# Patient Record
Sex: Male | Born: 1960 | Race: White | Hispanic: No | Marital: Married | Smoking: Former smoker
Health system: Southern US, Community
[De-identification: ages and names within clinical notes are randomized; demographics above are authoritative.]

## PROBLEM LIST (undated history)

## (undated) DIAGNOSIS — K219 Gastro-esophageal reflux disease without esophagitis: Secondary | ICD-10-CM

## (undated) DIAGNOSIS — F419 Anxiety disorder, unspecified: Secondary | ICD-10-CM

## (undated) DIAGNOSIS — G5760 Lesion of plantar nerve, unspecified lower limb: Secondary | ICD-10-CM

## (undated) DIAGNOSIS — B0229 Other postherpetic nervous system involvement: Secondary | ICD-10-CM

## (undated) HISTORY — DX: Anxiety disorder, unspecified: F41.9

## (undated) HISTORY — PX: TONSILLECTOMY: SUR1361

## (undated) HISTORY — DX: Other postherpetic nervous system involvement: B02.29

## (undated) HISTORY — DX: Lesion of plantar nerve, unspecified lower limb: G57.60

## (undated) HISTORY — DX: Gastro-esophageal reflux disease without esophagitis: K21.9

---

## 2007-06-16 ENCOUNTER — Ambulatory Visit (HOSPITAL_COMMUNITY): Admission: RE | Admit: 2007-06-16 | Discharge: 2007-06-16 | Payer: Self-pay | Admitting: Family Medicine

## 2007-06-20 ENCOUNTER — Ambulatory Visit (HOSPITAL_COMMUNITY): Admission: RE | Admit: 2007-06-20 | Discharge: 2007-06-20 | Payer: Self-pay | Admitting: Family Medicine

## 2011-06-16 ENCOUNTER — Telehealth: Payer: Self-pay

## 2011-06-16 NOTE — Telephone Encounter (Signed)
LMOM at home to call. Other available numbers were not working.

## 2011-06-19 NOTE — Telephone Encounter (Signed)
LMOM to call.  Mailing letter also to call.

## 2011-07-16 ENCOUNTER — Telehealth: Payer: Self-pay

## 2011-07-16 NOTE — Telephone Encounter (Signed)
LMOM to call.

## 2011-07-21 NOTE — Telephone Encounter (Signed)
LMOM for pt to call. Mailing a letter also. Sending a letter to Dr. Phillips Odor.

## 2012-08-02 ENCOUNTER — Telehealth: Payer: Self-pay

## 2012-08-02 NOTE — Telephone Encounter (Signed)
Called pt to triage for colonoscopy. OV with Tana Coast, PA on 08/22/2012 due to meds.

## 2012-08-22 ENCOUNTER — Other Ambulatory Visit: Payer: Self-pay | Admitting: Internal Medicine

## 2012-08-22 ENCOUNTER — Ambulatory Visit (INDEPENDENT_AMBULATORY_CARE_PROVIDER_SITE_OTHER): Payer: BC Managed Care – PPO | Admitting: Gastroenterology

## 2012-08-22 ENCOUNTER — Encounter: Payer: Self-pay | Admitting: Gastroenterology

## 2012-08-22 VITALS — BP 117/75 | HR 63 | Temp 98.2°F | Ht 69.0 in | Wt 185.8 lb

## 2012-08-22 DIAGNOSIS — Z1211 Encounter for screening for malignant neoplasm of colon: Secondary | ICD-10-CM

## 2012-08-22 DIAGNOSIS — K219 Gastro-esophageal reflux disease without esophagitis: Secondary | ICD-10-CM

## 2012-08-22 MED ORDER — PEG-KCL-NACL-NASULF-NA ASC-C 100 G PO SOLR: 1.0000 | ORAL | Status: DC

## 2012-08-22 NOTE — Progress Notes (Signed)
CC PCP 

## 2012-08-22 NOTE — Assessment & Plan Note (Signed)
Chronic GERD. Symptoms well-controlled on PPI. No prior upper endoscopy. Recommend EGD at this time to screen for Barrett's esophagus.  I have discussed the risks, alternatives, benefits with regards to but not limited to the risk of reaction to medication, bleeding, infection, perforation and the patient is agreeable to proceed. Written consent to be obtained.

## 2012-08-22 NOTE — Progress Notes (Signed)
Primary Care Physician:  MCGOUGH,WILLIAM M, MD  Primary Gastroenterologist:  Michael Rourk, MD   Chief Complaint  Patient presents with  . Colonoscopy    HPI:  Aaron Sheppard is a 51 y.o. male here to schedule his first ever screening colonoscopy. He was asked to be seen today in order to determine what type of sedation he would require given his chronic Xanax and Vicodin use. He denies any constipation, diarrhea, abdominal pain, melena, rectal bleeding, weight loss, dysphagia, odynophagia. He does have a history of chronic GERD. He has been on protonix for greater than 5 years. He states that he makes "too much acid" according to a test he had done years ago. He denies any prior upper endoscopy.  Current Outpatient Prescriptions  Medication Sig Dispense Refill  . ALPRAZolam (XANAX) 1 MG tablet 1 mg 4 (four) times daily as needed.       . HYDROcodone-acetaminophen (NORCO/VICODIN) 5-325 MG per tablet Take 1 tablet by mouth every 6 (six) hours as needed.       . pantoprazole (PROTONIX) 40 MG tablet Take 40 mg by mouth daily.        No current facility-administered medications for this visit.    Allergies as of 08/22/2012  . (No Known Allergies)    Past Medical History  Diagnosis Date  . Morton neuroma     chronic foot pain  . GERD (gastroesophageal reflux disease)     Past Surgical History  Procedure Laterality Date  . Tonsillectomy      Family History  Problem Relation Age of Onset  . Colon cancer Neg Hx   . Brain cancer Father   . Cancer Other     2 aunts, 3 uncles not sure what type  . Liver disease Neg Hx     History   Social History  . Marital Status: Married    Spouse Name: N/A    Number of Children: 0  . Years of Education: N/A   Occupational History  . [employer]    Social History Main Topics  . Smoking status: Current Every Day Smoker -- 0.50 packs/day    Types: Cigarettes  . Smokeless tobacco: Not on file  . Alcohol Use: Yes     Comment: a  beer twice a week  . Drug Use: No  . Sexually Active: Not on file   Other Topics Concern  . Not on file   Social History Narrative  . No narrative on file      ROS:  General: Negative for anorexia, weight loss, fever, chills, fatigue, weakness. Eyes: Negative for vision changes.  ENT: Negative for hoarseness, difficulty swallowing , nasal congestion. CV: Negative for chest pain, angina, palpitations, dyspnea on exertion, peripheral edema.  Respiratory: Negative for dyspnea at rest, dyspnea on exertion, cough, sputum, wheezing.  GI: See history of present illness. GU:  Negative for dysuria, hematuria, urinary incontinence, urinary frequency, nocturnal urination.  MS: chronic foot pain, negative for low back pain.  Derm: Negative for rash or itching.  Neuro: Negative for weakness, abnormal sensation, seizure, frequent headaches, memory loss, confusion.  Psych: Negative for anxiety, depression, suicidal ideation, hallucinations.  Endo: Negative for unusual weight change.  Heme: Negative for bruising or bleeding. Allergy: Negative for rash or hives.    Physical Examination:  BP 117/75  Pulse 63  Temp(Src) 98.2 F (36.8 C) (Oral)  Ht 5' 9" (1.753 m)  Wt 185 lb 12.8 oz (84.278 kg)  BMI 27.43 kg/m2   General:   Well-nourished, well-developed in no acute distress.  Head: Normocephalic, atraumatic.   Eyes: Conjunctiva pink, no icterus. Mouth: Oropharyngeal mucosa moist and pink , no lesions erythema or exudate. Neck: Supple without thyromegaly, masses, or lymphadenopathy.  Lungs: Clear to auscultation bilaterally.  Heart: Regular rate and rhythm, no murmurs rubs or gallops.  Abdomen: Bowel sounds are normal, nontender, nondistended, no hepatosplenomegaly or masses, no abdominal bruits or    hernia , no rebound or guarding.   Rectal: defer Extremities: No lower extremity edema. No clubbing or deformities.  Neuro: Alert and oriented x 4 , grossly normal neurologically.  Skin:  Warm and dry, no rash or jaundice.   Psych: Alert and cooperative, normal mood and affect.   

## 2012-08-22 NOTE — Patient Instructions (Addendum)
We have scheduled you for a colonoscopy and upper endoscopy with Dr. Rourk. Please see separate instructions. 

## 2012-08-22 NOTE — Assessment & Plan Note (Signed)
No prior colonoscopy. Average risk screening colonoscopy in the near future. Patient takes daily narcotics and Xanax therefore we'll augment conscious sedation with Phenergan 25 mg IV 30 minutes before procedure. Discussed with patient.  I have discussed the risks, alternatives, benefits with regards to but not limited to the risk of reaction to medication, bleeding, infection, perforation and the patient is agreeable to proceed. Written consent to be obtained.

## 2012-09-01 ENCOUNTER — Encounter (HOSPITAL_COMMUNITY): Payer: Self-pay | Admitting: Pharmacy Technician

## 2012-09-08 ENCOUNTER — Encounter (HOSPITAL_COMMUNITY): Payer: Self-pay | Admitting: *Deleted

## 2012-09-08 ENCOUNTER — Ambulatory Visit (HOSPITAL_COMMUNITY)
Admission: RE | Admit: 2012-09-08 | Discharge: 2012-09-08 | Disposition: A | Discharge status: Home Health | Payer: BC Managed Care – PPO | Source: Ambulatory Visit | Attending: Internal Medicine | Admitting: Internal Medicine

## 2012-09-08 ENCOUNTER — Encounter (HOSPITAL_COMMUNITY)
Admission: RE | Disposition: A | Discharge status: Home Health | Payer: Self-pay | Source: Ambulatory Visit | Attending: Internal Medicine

## 2012-09-08 DIAGNOSIS — Z1211 Encounter for screening for malignant neoplasm of colon: Secondary | ICD-10-CM

## 2012-09-08 DIAGNOSIS — K5730 Diverticulosis of large intestine without perforation or abscess without bleeding: Secondary | ICD-10-CM | POA: Insufficient documentation

## 2012-09-08 DIAGNOSIS — K219 Gastro-esophageal reflux disease without esophagitis: Secondary | ICD-10-CM

## 2012-09-08 HISTORY — PX: COLONOSCOPY: SHX5424

## 2012-09-08 SURGERY — COLONOSCOPY
Anesthesia: Moderate Sedation | Wound class: Clean Contaminated

## 2012-09-08 MED ORDER — SODIUM CHLORIDE 0.9 % IJ SOLN
INTRAMUSCULAR | Status: AC
  Filled 2012-09-08: qty 10

## 2012-09-08 MED ORDER — MIDAZOLAM HCL 5 MG/5ML IJ SOLN
INTRAMUSCULAR | Status: DC | PRN
  Administered 2012-09-08: 1 mg via INTRAVENOUS
  Administered 2012-09-08 (×2): 2 mg via INTRAVENOUS

## 2012-09-08 MED ORDER — ONDANSETRON HCL 4 MG/2ML IJ SOLN
INTRAMUSCULAR | Status: AC
  Filled 2012-09-08: qty 2

## 2012-09-08 MED ORDER — PROMETHAZINE HCL 25 MG/ML IJ SOLN
25.0000 mg | Freq: Once | INTRAMUSCULAR | Status: AC
  Administered 2012-09-08: 25 mg via INTRAVENOUS

## 2012-09-08 MED ORDER — MEPERIDINE HCL 100 MG/ML IJ SOLN
INTRAMUSCULAR | Status: DC | PRN
  Administered 2012-09-08: 50 mg via INTRAVENOUS
  Administered 2012-09-08: 25 mg via INTRAVENOUS

## 2012-09-08 MED ORDER — SODIUM CHLORIDE 0.9 % IV SOLN
INTRAVENOUS | Status: DC
  Administered 2012-09-08: 12:00:00 via INTRAVENOUS

## 2012-09-08 MED ORDER — ONDANSETRON HCL 4 MG/2ML IJ SOLN
INTRAMUSCULAR | Status: DC | PRN
  Administered 2012-09-08: 4 mg via INTRAVENOUS

## 2012-09-08 MED ORDER — STERILE WATER FOR IRRIGATION IR SOLN
Status: DC | PRN
  Administered 2012-09-08: 13:00:00

## 2012-09-08 MED ORDER — MIDAZOLAM HCL 5 MG/5ML IJ SOLN
INTRAMUSCULAR | Status: AC
  Filled 2012-09-08: qty 10

## 2012-09-08 MED ORDER — PROMETHAZINE HCL 25 MG/ML IJ SOLN
INTRAMUSCULAR | Status: AC
  Filled 2012-09-08: qty 1

## 2012-09-08 MED ORDER — MEPERIDINE HCL 100 MG/ML IJ SOLN
INTRAMUSCULAR | Status: AC
  Filled 2012-09-08: qty 1

## 2012-09-08 NOTE — OR Nursing (Signed)
Patient scheduled for a EGD and TCS. On arrival patient states that he called the office and told them that he wanted to cancel the EGD because his insurance would not pay for it. Dr. Jena Gauss notified. Will proceed with TCS only today.

## 2012-09-08 NOTE — Op Note (Signed)
[facility] [address] [place], [address]   COLONOSCOPY PROCEDURE REPORT  PATIENT: Aaron Sheppard, Aaron Sheppard  MR#:         [id number] BIRTHDATE: 05-07-1961 , 51  yrs. old GENDER: Male ENDOSCOPIST: R.  Roetta Sessions, MD FACP FACG REFERRED BY:  Karleen Hampshire, M.D. PROCEDURE DATE:  09/08/2012 PROCEDURE:     Screening colonoscopy  INDICATIONS: First ever average risk colorectal cancer screening examination; patient declines EGD for long-standing GERD  INFORMED CONSENT:  The risks, benefits, alternatives and imponderables including but not limited to bleeding, perforation as well as the possibility of a missed lesion have been reviewed.  The potential for biopsy, lesion removal, etc. have also been discussed.  Questions have been answered.  All parties agreeable. Please see the history and physical in the medical record for more information.  MEDICATIONS: Versed 5 mg IV Demerol 75 mg IV in divided doses. Zofran 4 mg Phenergan 25 mgI  DESCRIPTION OF PROCEDURE:  After a digital rectal exam was performed, the EC-3890Li ([id number])  colonoscope was advanced from the anus through the rectum and colon to the area of the cecum, ileocecal valve and appendiceal orifice.  The cecum was deeply intubated.  These structures were well-seen and photographed for the record.  From the level of the cecum and ileocecal valve, the scope was slowly and cautiously withdrawn.  The mucosal surfaces were carefully surveyed utilizing scope tip deflection to facilitate fold flattening as needed.  The scope was pulled down into the rectum where a thorough examination including retroflexion was performed.    FINDINGS:  Adequate preparation. Normal rectum. Scattered left-sided diverticula; the remainder of the colonic mucosa appeared normal.  THERAPEUTIC / DIAGNOSTIC MANEUVERS PERFORMED:  none  COMPLICATIONS: None  CECAL WITHDRAWAL TIME:  11 minutes  IMPRESSION:  Colonic  diverticulosis  RECOMMENDATIONS: Repeat screening colonoscopy in 10 years   _______________________________ eSigned:  R. Roetta Sessions, MD FACP [facility] 09/08/2012 1:09 PM   CC:

## 2012-09-08 NOTE — H&P (View-Only) (Signed)
Primary Care Physician:  Kirk Ruths, MD  Primary Gastroenterologist:  Roetta Sessions, MD   Chief Complaint  Patient presents with  . Colonoscopy    HPI:  Aaron Sheppard is a 52 y.o. male here to schedule his first ever screening colonoscopy. He was asked to be seen today in order to determine what type of sedation he would require given his chronic Xanax and Vicodin use. He denies any constipation, diarrhea, abdominal pain, melena, rectal bleeding, weight loss, dysphagia, odynophagia. He does have a history of chronic GERD. He has been on protonix for greater than 5 years. He states that he makes "too much acid" according to a test he had done years ago. He denies any prior upper endoscopy.  Current Outpatient Prescriptions  Medication Sig Dispense Refill  . ALPRAZolam (XANAX) 1 MG tablet 1 mg 4 (four) times daily as needed.       Marland Kitchen HYDROcodone-acetaminophen (NORCO/VICODIN) 5-325 MG per tablet Take 1 tablet by mouth every 6 (six) hours as needed.       . pantoprazole (PROTONIX) 40 MG tablet Take 40 mg by mouth daily.        No current facility-administered medications for this visit.    Allergies as of 08/22/2012  . (No Known Allergies)    Past Medical History  Diagnosis Date  . Morton neuroma     chronic foot pain  . GERD (gastroesophageal reflux disease)     Past Surgical History  Procedure Laterality Date  . Tonsillectomy      Family History  Problem Relation Age of Onset  . Colon cancer Neg Hx   . Brain cancer Father   . Cancer Other     2 aunts, 3 uncles not sure what type  . Liver disease Neg Hx     History   Social History  . Marital Status: Married    Spouse Name: N/A    Number of Children: 0  . Years of Education: N/A   Occupational History  . [employer]    Social History Main Topics  . Smoking status: Current Every Day Smoker -- 0.50 packs/day    Types: Cigarettes  . Smokeless tobacco: Not on file  . Alcohol Use: Yes     Comment: a  beer twice a week  . Drug Use: No  . Sexually Active: Not on file   Other Topics Concern  . Not on file   Social History Narrative  . No narrative on file      ROS:  General: Negative for anorexia, weight loss, fever, chills, fatigue, weakness. Eyes: Negative for vision changes.  ENT: Negative for hoarseness, difficulty swallowing , nasal congestion. CV: Negative for chest pain, angina, palpitations, dyspnea on exertion, peripheral edema.  Respiratory: Negative for dyspnea at rest, dyspnea on exertion, cough, sputum, wheezing.  GI: See history of present illness. GU:  Negative for dysuria, hematuria, urinary incontinence, urinary frequency, nocturnal urination.  MS: chronic foot pain, negative for low back pain.  Derm: Negative for rash or itching.  Neuro: Negative for weakness, abnormal sensation, seizure, frequent headaches, memory loss, confusion.  Psych: Negative for anxiety, depression, suicidal ideation, hallucinations.  Endo: Negative for unusual weight change.  Heme: Negative for bruising or bleeding. Allergy: Negative for rash or hives.    Physical Examination:  BP 117/75  Pulse 63  Temp(Src) 98.2 F (36.8 C) (Oral)  Ht 5\' 9"  (1.753 m)  Wt 185 lb 12.8 oz (84.278 kg)  BMI 27.43 kg/m2   General:  Well-nourished, well-developed in no acute distress.  Head: Normocephalic, atraumatic.   Eyes: Conjunctiva pink, no icterus. Mouth: Oropharyngeal mucosa moist and pink , no lesions erythema or exudate. Neck: Supple without thyromegaly, masses, or lymphadenopathy.  Lungs: Clear to auscultation bilaterally.  Heart: Regular rate and rhythm, no murmurs rubs or gallops.  Abdomen: Bowel sounds are normal, nontender, nondistended, no hepatosplenomegaly or masses, no abdominal bruits or    hernia , no rebound or guarding.   Rectal: defer Extremities: No lower extremity edema. No clubbing or deformities.  Neuro: Alert and oriented x 4 , grossly normal neurologically.  Skin:  Warm and dry, no rash or jaundice.   Psych: Alert and cooperative, normal mood and affect.

## 2012-09-08 NOTE — Interval H&P Note (Signed)
History and Physical Interval Note:  09/08/2012 12:33 PM  Aaron Sheppard  has presented today for surgery, with the diagnosis of Chronic GERD and Screening Colonoscopy  The various methods of treatment have been discussed with the patient and family. After consideration of risks, benefits and other options for treatment, the patient has consented to  Procedure(s) with comments: COLONOSCOPY WITH ESOPHAGOGASTRODUODENOSCOPY (EGD) (N/A) - 1:00 as a surgical intervention .  The patient's history has been reviewed, patient examined, no change in status, stable for surgery.  I have reviewed the patient's chart and labs.  Questions were answered to the patient's satisfaction.     Eula Listen Colonoscopy today for screening purposes per plan. Patient declines to have an EGD sliding financial responsibility. I explained to him the risks of not diagnosing of Barrett's, etc. He seems to understand.The risks, benefits, limitations, alternatives and imponderables have been reviewed with the patient. Questions have been answered. All parties are agreeable.

## 2012-09-12 ENCOUNTER — Encounter (HOSPITAL_COMMUNITY): Payer: Self-pay | Admitting: Internal Medicine

## 2012-10-19 ENCOUNTER — Telehealth: Payer: Self-pay | Admitting: General Practice

## 2012-10-19 NOTE — Telephone Encounter (Signed)
Patient called an wondered why his tcs wasn't covered at 100%.  I spoke with Neill Loft. At [phone] and she stated the [facility] plan states screenings covered at 80% and Medical at 90%.   I called and explained that to the patient.  He voiced understanding.

## 2012-11-23 NOTE — Progress Notes (Signed)
REVIEWED.  

## 2013-03-16 ENCOUNTER — Other Ambulatory Visit: Payer: Self-pay

## 2014-07-03 ENCOUNTER — Emergency Department (HOSPITAL_COMMUNITY): Payer: BLUE CROSS/BLUE SHIELD

## 2014-07-03 ENCOUNTER — Encounter (HOSPITAL_COMMUNITY): Payer: Self-pay | Admitting: *Deleted

## 2014-07-03 ENCOUNTER — Emergency Department (HOSPITAL_COMMUNITY)
Admission: EM | Admit: 2014-07-03 | Discharge: 2014-07-03 | Disposition: A | Discharge status: Home / Self Care | Payer: BLUE CROSS/BLUE SHIELD | Attending: Emergency Medicine | Admitting: Emergency Medicine

## 2014-07-03 DIAGNOSIS — S199XXA Unspecified injury of neck, initial encounter: Secondary | ICD-10-CM | POA: Insufficient documentation

## 2014-07-03 DIAGNOSIS — G8929 Other chronic pain: Secondary | ICD-10-CM | POA: Insufficient documentation

## 2014-07-03 DIAGNOSIS — M545 Low back pain: Secondary | ICD-10-CM

## 2014-07-03 DIAGNOSIS — S3992XA Unspecified injury of lower back, initial encounter: Secondary | ICD-10-CM | POA: Insufficient documentation

## 2014-07-03 DIAGNOSIS — Z720 Tobacco use: Secondary | ICD-10-CM | POA: Diagnosis not present

## 2014-07-03 DIAGNOSIS — K219 Gastro-esophageal reflux disease without esophagitis: Secondary | ICD-10-CM | POA: Diagnosis not present

## 2014-07-03 DIAGNOSIS — M542 Cervicalgia: Secondary | ICD-10-CM

## 2014-07-03 DIAGNOSIS — Y998 Other external cause status: Secondary | ICD-10-CM | POA: Diagnosis not present

## 2014-07-03 DIAGNOSIS — Y92410 Unspecified street and highway as the place of occurrence of the external cause: Secondary | ICD-10-CM | POA: Diagnosis not present

## 2014-07-03 DIAGNOSIS — Z79899 Other long term (current) drug therapy: Secondary | ICD-10-CM | POA: Insufficient documentation

## 2014-07-03 DIAGNOSIS — Y9389 Activity, other specified: Secondary | ICD-10-CM | POA: Diagnosis not present

## 2014-07-03 LAB — URINALYSIS, ROUTINE W REFLEX MICROSCOPIC
Bilirubin Urine: NEGATIVE
Glucose, UA: NEGATIVE mg/dL
Ketones, ur: NEGATIVE mg/dL
Leukocytes, UA: NEGATIVE
Nitrite: NEGATIVE
Protein, ur: NEGATIVE mg/dL
Specific Gravity, Urine: 1.01 (ref 1.005–1.030)
Urobilinogen, UA: 0.2 mg/dL (ref 0.0–1.0)
pH: 5.5 (ref 5.0–8.0)

## 2014-07-03 LAB — URINE MICROSCOPIC-ADD ON

## 2014-07-03 MED ORDER — NAPROXEN 500 MG PO TABS: ORAL_TABLET | ORAL | Status: DC

## 2014-07-03 MED ORDER — CYCLOBENZAPRINE HCL 5 MG PO TABS: 5.0000 mg | ORAL_TABLET | Freq: Three times a day (TID) | ORAL | Status: DC | PRN

## 2014-07-03 MED ORDER — CYCLOBENZAPRINE HCL 10 MG PO TABS
5.0000 mg | ORAL_TABLET | Freq: Once | ORAL | Status: AC
  Administered 2014-07-03: 5 mg via ORAL
  Filled 2014-07-03: qty 1

## 2014-07-03 MED ORDER — IBUPROFEN 800 MG PO TABS
800.0000 mg | ORAL_TABLET | Freq: Once | ORAL | Status: AC
  Administered 2014-07-03: 800 mg via ORAL
  Filled 2014-07-03: qty 1

## 2014-07-03 NOTE — Discharge Instructions (Signed)
Ice packs and heat  to the injured or sore muscles. Take the medications for pain and muscle spasms. Return to the ED for any problems listed on the head injury sheet. Recheck if you aren't improving in the next week.
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
## 2014-08-01 ENCOUNTER — Other Ambulatory Visit (HOSPITAL_COMMUNITY): Payer: Self-pay | Admitting: Internal Medicine

## 2014-08-01 DIAGNOSIS — M5127 Other intervertebral disc displacement, lumbosacral region: Secondary | ICD-10-CM

## 2014-08-01 DIAGNOSIS — M5020 Other cervical disc displacement, unspecified cervical region: Secondary | ICD-10-CM

## 2014-08-01 DIAGNOSIS — M5410 Radiculopathy, site unspecified: Secondary | ICD-10-CM

## 2014-08-09 ENCOUNTER — Ambulatory Visit (HOSPITAL_COMMUNITY)
Admission: RE | Admit: 2014-08-09 | Discharge: 2014-08-09 | Disposition: A | Discharge status: Home / Self Care | Payer: BLUE CROSS/BLUE SHIELD | Source: Ambulatory Visit | Attending: Internal Medicine | Admitting: Internal Medicine

## 2014-08-09 DIAGNOSIS — M4802 Spinal stenosis, cervical region: Secondary | ICD-10-CM | POA: Insufficient documentation

## 2014-08-09 DIAGNOSIS — M5127 Other intervertebral disc displacement, lumbosacral region: Secondary | ICD-10-CM

## 2014-08-09 DIAGNOSIS — M5020 Other cervical disc displacement, unspecified cervical region: Secondary | ICD-10-CM

## 2014-08-09 DIAGNOSIS — M5022 Other cervical disc displacement, mid-cervical region: Secondary | ICD-10-CM | POA: Insufficient documentation

## 2014-08-09 DIAGNOSIS — M5410 Radiculopathy, site unspecified: Secondary | ICD-10-CM

## 2014-08-13 ENCOUNTER — Other Ambulatory Visit (HOSPITAL_COMMUNITY): Payer: BLUE CROSS/BLUE SHIELD

## 2016-05-28 ENCOUNTER — Other Ambulatory Visit (HOSPITAL_COMMUNITY): Payer: Self-pay | Admitting: Family Medicine

## 2016-05-28 ENCOUNTER — Ambulatory Visit (HOSPITAL_COMMUNITY)
Admission: RE | Admit: 2016-05-28 | Discharge: 2016-05-28 | Disposition: A | Discharge status: Home / Self Care | Payer: BLUE CROSS/BLUE SHIELD | Source: Ambulatory Visit | Attending: Family Medicine | Admitting: Family Medicine

## 2016-05-28 DIAGNOSIS — J111 Influenza due to unidentified influenza virus with other respiratory manifestations: Secondary | ICD-10-CM | POA: Diagnosis present

## 2016-05-28 DIAGNOSIS — J069 Acute upper respiratory infection, unspecified: Secondary | ICD-10-CM | POA: Diagnosis present

## 2016-08-04 ENCOUNTER — Telehealth: Payer: Self-pay | Admitting: Internal Medicine

## 2016-08-04 NOTE — Telephone Encounter (Signed)
Pt is having some rectal bleeding, bright red blood. He is aware he has some hemorrhoids. He is scheduled an OV with Wynne DustEric Gill, NP on 08/26/2016 at 8:30 AM.  Routing FYI to Tana CoastLeslie Lewis, PA who last saw pt in the office several years ago.

## 2016-08-04 NOTE — Telephone Encounter (Signed)
Pt called wanting to set up colonoscopy. His last one was in May 2014 and is one to recall to repeat again in 10 yrs (2024).  Pt is wanting to have it done before then, but has a lot of questions about his insurance and how it'll be coded. He didn't want to make an OV prior to scheduling. I told him that I would let the triage nurse know that he had called. [phone]

## 2016-08-05 NOTE — Telephone Encounter (Signed)
Pt called and said he had a BM last night and he did not have bleeding. He is aware if he has the symptoms to call or go to ED. He will keep the OV as scheduled.

## 2016-08-05 NOTE — Telephone Encounter (Signed)
If modest rectal bleeding, ok for OV as scheduled in couple of weeks. If having significant rectal bleeding, lightheadedness, abdominal pain, melena, then go to ER.

## 2016-08-05 NOTE — Telephone Encounter (Signed)
LMOM to call.

## 2017-02-10 IMAGING — DX DG CHEST 2V
2 series · 2 of 2 positions shown · non-contrast
Comparison: 06/16/2007

CLINICAL DATA: Cough, congestion, and nasal drainage for 2 weeks

EXAM:
CHEST  2 VIEW

[chest pa]
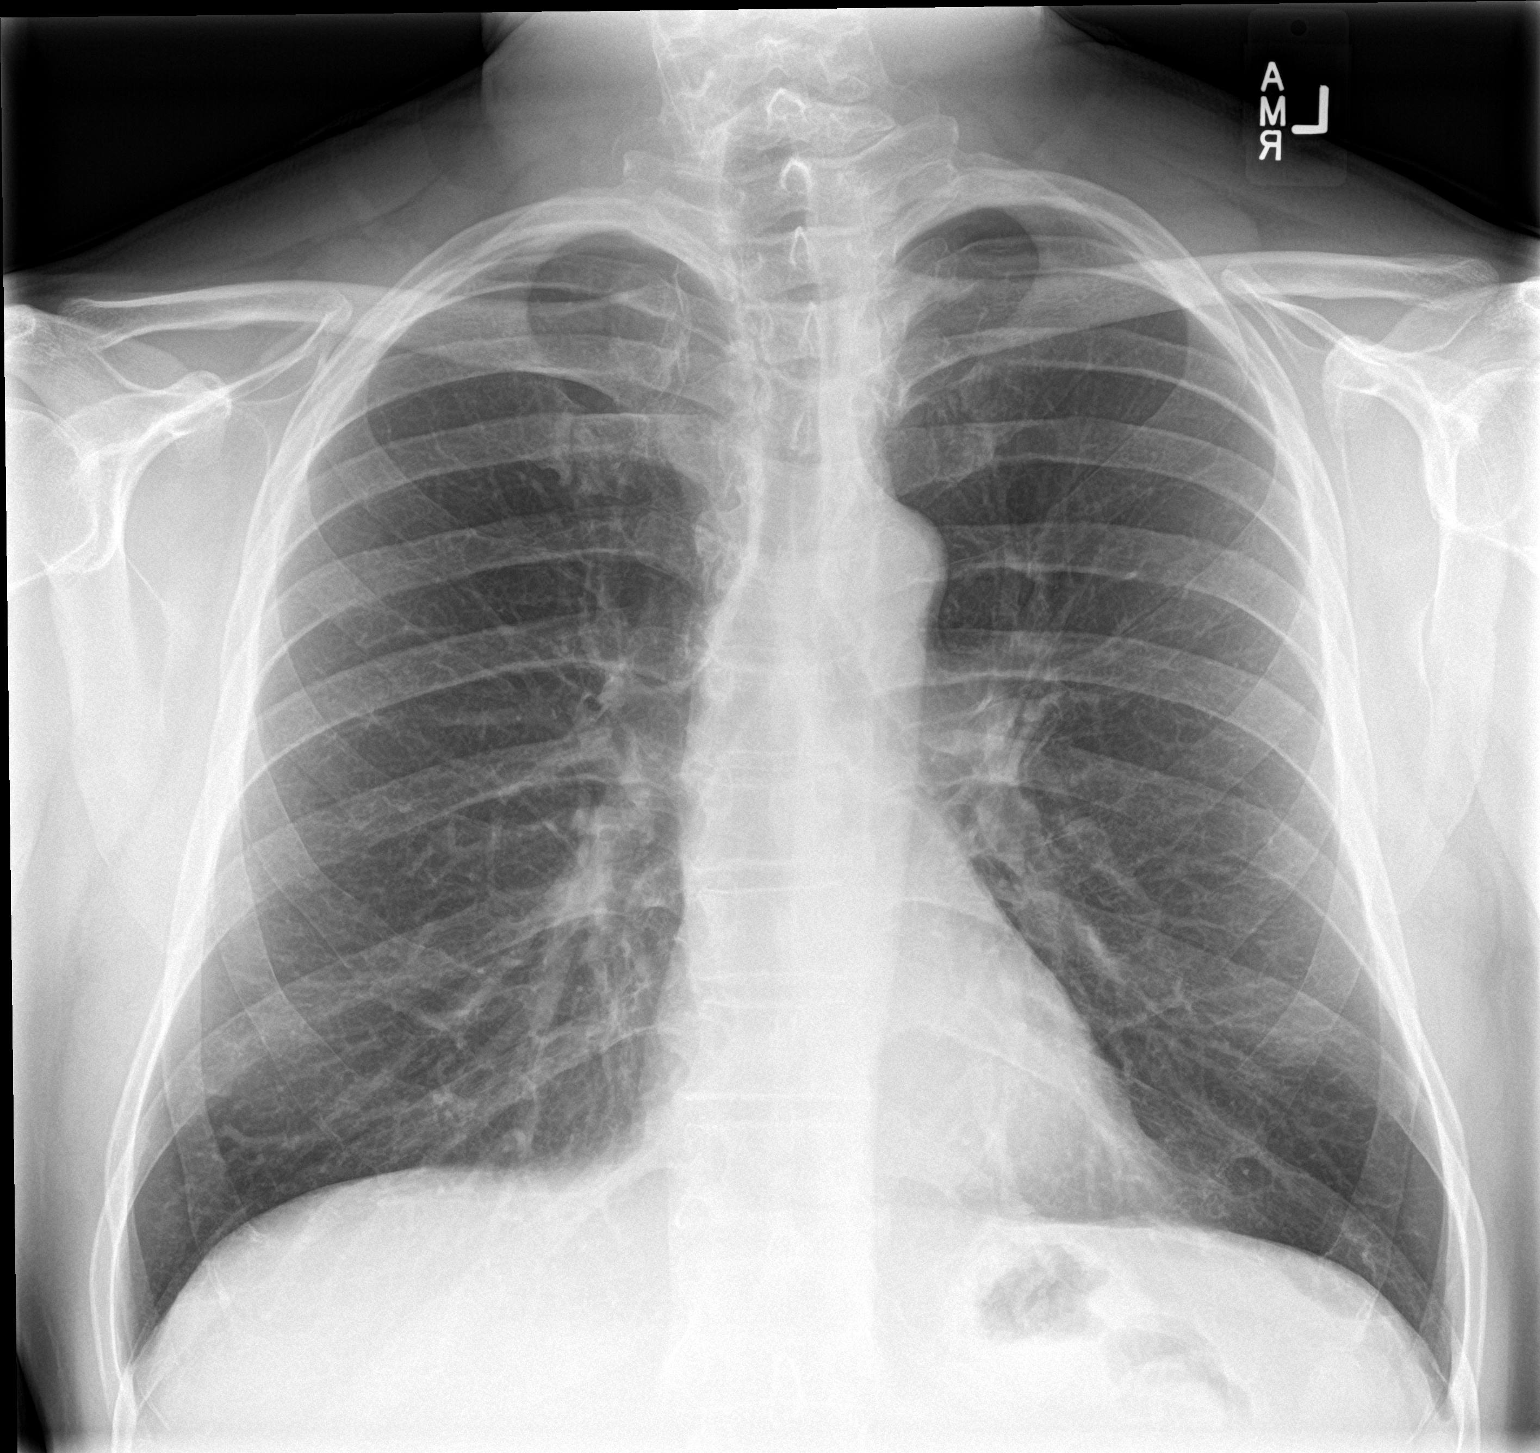

[chest lat]
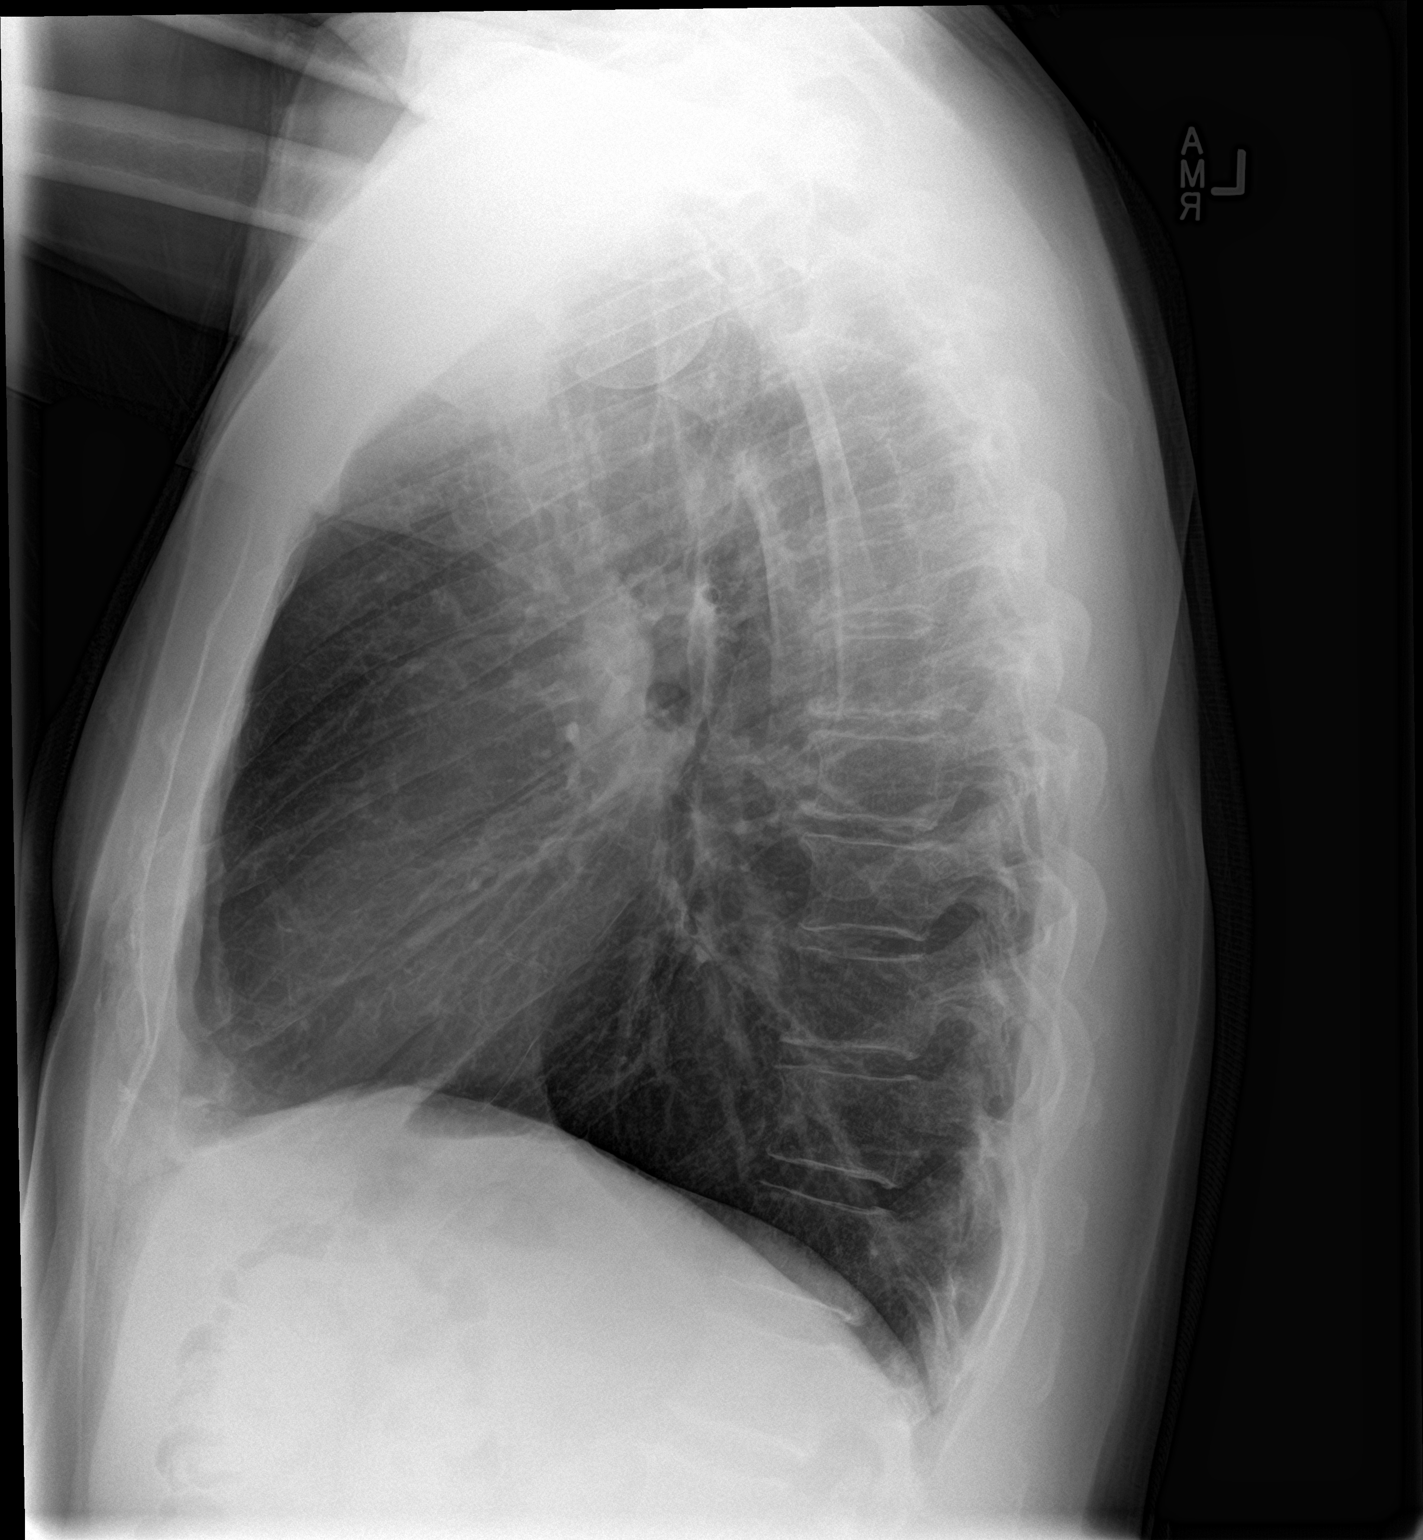

[2 of 2 positions shown; findings below may reference images not displayed]

FINDINGS: Normal heart size. Lungs clear. No pneumothorax. No pleural
effusion.
IMPRESSION: No active cardiopulmonary disease.

## 2018-12-06 ENCOUNTER — Ambulatory Visit (INDEPENDENT_AMBULATORY_CARE_PROVIDER_SITE_OTHER): Payer: Managed Care, Other (non HMO) | Admitting: Urology

## 2018-12-06 ENCOUNTER — Other Ambulatory Visit (HOSPITAL_COMMUNITY)
Admission: AD | Admit: 2018-12-06 | Discharge: 2018-12-06 | Disposition: A | Discharge status: Home / Self Care | Payer: BLUE CROSS/BLUE SHIELD | Source: Other Acute Inpatient Hospital | Attending: Urology | Admitting: Urology

## 2018-12-06 DIAGNOSIS — K4090 Unilateral inguinal hernia, without obstruction or gangrene, not specified as recurrent: Secondary | ICD-10-CM

## 2018-12-06 DIAGNOSIS — R310 Gross hematuria: Secondary | ICD-10-CM | POA: Diagnosis not present

## 2018-12-06 LAB — URINALYSIS, COMPLETE (UACMP) WITH MICROSCOPIC
Bacteria, UA: NONE SEEN
Bilirubin Urine: NEGATIVE
Glucose, UA: NEGATIVE mg/dL
Ketones, ur: NEGATIVE mg/dL
Leukocytes,Ua: NEGATIVE
Nitrite: NEGATIVE
Protein, ur: NEGATIVE mg/dL
Specific Gravity, Urine: 1.006 (ref 1.005–1.030)
pH: 5 (ref 5.0–8.0)

## 2018-12-13 ENCOUNTER — Other Ambulatory Visit: Payer: Self-pay | Admitting: Urology

## 2018-12-13 DIAGNOSIS — R310 Gross hematuria: Secondary | ICD-10-CM

## 2020-01-30 ENCOUNTER — Other Ambulatory Visit: Payer: Self-pay

## 2020-01-30 DIAGNOSIS — Z20822 Contact with and (suspected) exposure to covid-19: Secondary | ICD-10-CM

## 2020-02-01 LAB — NOVEL CORONAVIRUS, NAA: SARS-CoV-2, NAA: NOT DETECTED

## 2020-02-01 LAB — SPECIMEN STATUS REPORT

## 2020-02-01 LAB — SARS-COV-2, NAA 2 DAY TAT

## 2020-03-07 ENCOUNTER — Other Ambulatory Visit: Payer: Self-pay

## 2020-03-07 ENCOUNTER — Other Ambulatory Visit: Payer: BLUE CROSS/BLUE SHIELD

## 2020-03-07 DIAGNOSIS — Z20822 Contact with and (suspected) exposure to covid-19: Secondary | ICD-10-CM

## 2020-03-08 LAB — NOVEL CORONAVIRUS, NAA: SARS-CoV-2, NAA: NOT DETECTED

## 2020-03-08 LAB — SARS-COV-2, NAA 2 DAY TAT

## 2020-03-08 LAB — SPECIMEN STATUS REPORT

## 2020-03-11 ENCOUNTER — Other Ambulatory Visit: Payer: Self-pay | Admitting: Urology

## 2020-03-11 DIAGNOSIS — R310 Gross hematuria: Secondary | ICD-10-CM

## 2020-03-14 ENCOUNTER — Other Ambulatory Visit: Payer: Self-pay

## 2020-03-14 ENCOUNTER — Other Ambulatory Visit: Payer: BLUE CROSS/BLUE SHIELD

## 2020-03-14 DIAGNOSIS — Z20822 Contact with and (suspected) exposure to covid-19: Secondary | ICD-10-CM

## 2020-03-15 LAB — SARS-COV-2, NAA 2 DAY TAT

## 2020-03-15 LAB — NOVEL CORONAVIRUS, NAA: SARS-CoV-2, NAA: NOT DETECTED

## 2020-03-15 LAB — SPECIMEN STATUS REPORT

## 2020-03-20 ENCOUNTER — Telehealth: Payer: Self-pay | Admitting: Urology

## 2020-03-20 NOTE — Telephone Encounter (Signed)
-----   Message from Marcine Matar, MD sent at 03/19/2020  7:26 PM EST ----- Pt needs f/u OV following CT--overdue. Please schedule ----- Message ----- From: SYSTEM Sent: 03/16/2020  12:12 AM EST To: Marcine Matar, MD

## 2020-03-20 NOTE — Telephone Encounter (Signed)
I called patient to check with him on scheduling the CT that Dr Retta Diones sent [place] a note on. Patient stated he wasn't sure why the CT is needed. He asked for more information regarding this.

## 2020-03-20 NOTE — Telephone Encounter (Signed)
Pt notified of f/u OV w/ ct. Pt states that he will call office back after talking with his wife to schedule f/u and have the ct done.

## 2020-03-21 ENCOUNTER — Other Ambulatory Visit: Payer: Self-pay | Admitting: *Deleted

## 2020-03-21 ENCOUNTER — Other Ambulatory Visit: Payer: BLUE CROSS/BLUE SHIELD

## 2020-03-21 DIAGNOSIS — Z20822 Contact with and (suspected) exposure to covid-19: Secondary | ICD-10-CM

## 2020-03-23 LAB — NOVEL CORONAVIRUS, NAA: SARS-CoV-2, NAA: NOT DETECTED

## 2020-03-23 LAB — SPECIMEN STATUS REPORT

## 2020-03-23 LAB — SARS-COV-2, NAA 2 DAY TAT

## 2020-03-27 ENCOUNTER — Telehealth: Payer: Self-pay

## 2020-03-27 NOTE — Telephone Encounter (Signed)
Called pt to notify him of new appt. Lft msg at both numbers.

## 2020-03-28 ENCOUNTER — Other Ambulatory Visit: Payer: Self-pay

## 2020-03-28 ENCOUNTER — Other Ambulatory Visit: Payer: BLUE CROSS/BLUE SHIELD

## 2020-03-28 DIAGNOSIS — Z20822 Contact with and (suspected) exposure to covid-19: Secondary | ICD-10-CM

## 2020-03-29 LAB — SPECIMEN STATUS REPORT

## 2020-03-29 LAB — SARS-COV-2, NAA 2 DAY TAT

## 2020-03-29 LAB — NOVEL CORONAVIRUS, NAA: SARS-CoV-2, NAA: NOT DETECTED

## 2020-03-29 NOTE — Progress Notes (Signed)
Pt scheduled  

## 2020-04-02 ENCOUNTER — Other Ambulatory Visit: Payer: BLUE CROSS/BLUE SHIELD

## 2020-04-09 ENCOUNTER — Other Ambulatory Visit: Payer: Self-pay

## 2020-04-09 ENCOUNTER — Other Ambulatory Visit: Payer: BLUE CROSS/BLUE SHIELD

## 2020-04-09 ENCOUNTER — Ambulatory Visit: Payer: BLUE CROSS/BLUE SHIELD | Admitting: Urology

## 2020-04-09 DIAGNOSIS — Z20822 Contact with and (suspected) exposure to covid-19: Secondary | ICD-10-CM

## 2020-04-09 NOTE — Progress Notes (Incomplete)
H&P  Chief Complaint: ***  History of Present Illness:  11.30.2021:  (below copied from [facility] records):  Aaron Sheppard is a 59 year-old male patient who was referred by Dr. Redmond School, MD who is here for blood in the urine.  He did see the blood in his urine. He first noticed the symptoms 07/10/2018. He has not seen blood clots.   He does have a burning sensation when he urinates. He is not currently having trouble urinating.   He is not having pain. He has not recently had unwanted weight loss.   7.28.2020: Starting in March, he woke up with blood on the front of his underwear at least two mornings -- he reports that there were substantial blood spots. Since then he has had several instances of gross hematuria with blood throughout the stream. He has not seen any blood since. He now is experiencing a slower stream with dysuria. He reports a family history of bladder cancer and the patient has been smoking consistently for the last five years but has smoked previously.    Past Medical History:  Diagnosis Date  . GERD (gastroesophageal reflux disease)   . Morton neuroma    chronic foot pain    Past Surgical History:  Procedure Laterality Date  . COLONOSCOPY N/A 09/08/2012   Procedure: COLONOSCOPY;  Surgeon: Daneil Dolin, MD;  [facility];  Service: Endoscopy;  Laterality: N/A;  . TONSILLECTOMY      Home Medications:  Allergies as of 04/09/2020   No Known Allergies     Medication List       Accurate as of April 09, 2020  9:35 AM. If you have any questions, ask your nurse or doctor.        ALPRAZolam 1 MG tablet Commonly known as: XANAX 1 mg 4 (four) times daily as needed for anxiety.   cyclobenzaprine 5 MG tablet Commonly known as: FLEXERIL Take 1 tablet (5 mg total) by mouth 3 (three) times daily as needed (for muscle soreness).   HYDROcodone-acetaminophen 5-325 MG tablet Commonly known as: NORCO/VICODIN Take 1 tablet by mouth every 6 (six) hours  as needed for pain.   naproxen 500 MG tablet Commonly known as: NAPROSYN Take 1 po BID with food prn pain   pantoprazole 40 MG tablet Commonly known as: PROTONIX Take 40 mg by mouth daily.   peg 3350 powder 100 g Solr Commonly known as: MOVIPREP Take 1 kit (100 g total) by mouth as directed.       Allergies: No Known Allergies  Family History  Problem Relation Age of Onset  . Colon cancer Neg Hx   . Brain cancer Father   . Cancer Other        2 aunts, 3 uncles not sure what type  . Liver disease Neg Hx     Social History:  reports that he has been smoking cigarettes. He has a 3.75 pack-year smoking history. He does not have any smokeless tobacco history on file. He reports current alcohol use. He reports that he does not use drugs.  ROS: A complete review of systems was performed.  All systems are negative except for pertinent findings as noted.  Physical Exam:  Vital signs in last 24 hours: There were no vitals taken for this visit. Constitutional:  Alert and oriented, No acute distress Cardiovascular: Regular rate  Respiratory: Normal respiratory effort GI: Abdomen is soft, nontender, nondistended, no abdominal masses. No CVAT.  Genitourinary: Normal male phallus, testes are  descended bilaterally and non-tender and without masses, scrotum is normal in appearance without lesions or masses, perineum is normal on inspection. Lymphatic: No lymphadenopathy Neurologic: Grossly intact, no focal deficits Psychiatric: Normal mood and affect  Laboratory Data:  No results for input(s): WBC, HGB, HCT, PLT in the last 72 hours.  No results for input(s): NA, K, CL, GLUCOSE, BUN, CALCIUM, CREATININE in the last 72 hours.  Invalid input(s): CO3   No results found for this or any previous visit (from the past 24 hour(s)). No results found for this or any previous visit (from the past 240 hour(s)).  Renal Function: No results for input(s): CREATININE in the last 168  hours. CrCl cannot be calculated (No successful lab value found.).  Radiologic Imaging: No results found.  Impression/Assessment:  ***  Plan:  ***

## 2020-04-10 LAB — NOVEL CORONAVIRUS, NAA: SARS-CoV-2, NAA: NOT DETECTED

## 2020-04-10 LAB — SPECIMEN STATUS REPORT

## 2020-04-10 LAB — SARS-COV-2, NAA 2 DAY TAT

## 2020-04-16 ENCOUNTER — Other Ambulatory Visit: Payer: BLUE CROSS/BLUE SHIELD

## 2020-04-16 ENCOUNTER — Other Ambulatory Visit: Payer: Self-pay | Admitting: *Deleted

## 2020-04-16 DIAGNOSIS — Z20822 Contact with and (suspected) exposure to covid-19: Secondary | ICD-10-CM

## 2020-04-17 LAB — SARS-COV-2, NAA 2 DAY TAT

## 2020-04-17 LAB — NOVEL CORONAVIRUS, NAA: SARS-CoV-2, NAA: NOT DETECTED

## 2020-04-17 LAB — SPECIMEN STATUS REPORT

## 2020-04-23 ENCOUNTER — Other Ambulatory Visit: Payer: Self-pay

## 2020-04-23 ENCOUNTER — Other Ambulatory Visit: Payer: BLUE CROSS/BLUE SHIELD

## 2020-04-23 DIAGNOSIS — Z20822 Contact with and (suspected) exposure to covid-19: Secondary | ICD-10-CM

## 2020-04-24 LAB — SPECIMEN STATUS REPORT

## 2020-04-24 LAB — NOVEL CORONAVIRUS, NAA: SARS-CoV-2, NAA: NOT DETECTED

## 2020-04-24 LAB — SARS-COV-2, NAA 2 DAY TAT

## 2020-04-30 ENCOUNTER — Other Ambulatory Visit: Payer: BLUE CROSS/BLUE SHIELD

## 2020-04-30 ENCOUNTER — Other Ambulatory Visit: Payer: Self-pay

## 2020-04-30 DIAGNOSIS — Z20822 Contact with and (suspected) exposure to covid-19: Secondary | ICD-10-CM

## 2020-05-02 LAB — NOVEL CORONAVIRUS, NAA: SARS-CoV-2, NAA: NOT DETECTED

## 2020-05-02 LAB — SPECIMEN STATUS REPORT

## 2020-05-02 LAB — SARS-COV-2, NAA 2 DAY TAT

## 2020-05-17 ENCOUNTER — Other Ambulatory Visit: Payer: BLUE CROSS/BLUE SHIELD

## 2020-05-17 DIAGNOSIS — Z20822 Contact with and (suspected) exposure to covid-19: Secondary | ICD-10-CM

## 2020-05-21 ENCOUNTER — Other Ambulatory Visit: Payer: BLUE CROSS/BLUE SHIELD

## 2020-05-21 DIAGNOSIS — Z20822 Contact with and (suspected) exposure to covid-19: Secondary | ICD-10-CM

## 2020-05-21 LAB — NOVEL CORONAVIRUS, NAA: SARS-CoV-2, NAA: NOT DETECTED

## 2020-05-23 LAB — SARS-COV-2, NAA 2 DAY TAT

## 2020-05-23 LAB — NOVEL CORONAVIRUS, NAA: SARS-CoV-2, NAA: NOT DETECTED

## 2020-05-28 ENCOUNTER — Other Ambulatory Visit: Payer: BLUE CROSS/BLUE SHIELD

## 2020-05-30 ENCOUNTER — Other Ambulatory Visit: Payer: BLUE CROSS/BLUE SHIELD

## 2020-05-31 ENCOUNTER — Other Ambulatory Visit: Payer: BLUE CROSS/BLUE SHIELD

## 2020-06-04 ENCOUNTER — Other Ambulatory Visit: Payer: BLUE CROSS/BLUE SHIELD

## 2020-06-04 DIAGNOSIS — Z20822 Contact with and (suspected) exposure to covid-19: Secondary | ICD-10-CM

## 2020-06-05 LAB — NOVEL CORONAVIRUS, NAA: SARS-CoV-2, NAA: NOT DETECTED

## 2020-06-05 LAB — SARS-COV-2, NAA 2 DAY TAT

## 2020-06-11 ENCOUNTER — Other Ambulatory Visit: Payer: BLUE CROSS/BLUE SHIELD

## 2020-06-11 DIAGNOSIS — Z20822 Contact with and (suspected) exposure to covid-19: Secondary | ICD-10-CM

## 2020-06-12 LAB — NOVEL CORONAVIRUS, NAA: SARS-CoV-2, NAA: NOT DETECTED

## 2020-06-12 LAB — SARS-COV-2, NAA 2 DAY TAT

## 2020-06-18 ENCOUNTER — Other Ambulatory Visit: Payer: BLUE CROSS/BLUE SHIELD

## 2020-06-18 DIAGNOSIS — Z20822 Contact with and (suspected) exposure to covid-19: Secondary | ICD-10-CM

## 2020-06-19 LAB — NOVEL CORONAVIRUS, NAA: SARS-CoV-2, NAA: NOT DETECTED

## 2020-06-19 LAB — SARS-COV-2, NAA 2 DAY TAT

## 2020-06-25 ENCOUNTER — Other Ambulatory Visit: Payer: BLUE CROSS/BLUE SHIELD

## 2020-06-25 DIAGNOSIS — Z20822 Contact with and (suspected) exposure to covid-19: Secondary | ICD-10-CM

## 2020-06-26 LAB — NOVEL CORONAVIRUS, NAA: SARS-CoV-2, NAA: NOT DETECTED

## 2020-06-26 LAB — SARS-COV-2, NAA 2 DAY TAT

## 2020-07-02 ENCOUNTER — Other Ambulatory Visit: Payer: BLUE CROSS/BLUE SHIELD

## 2022-03-22 ENCOUNTER — Encounter: Payer: Self-pay | Admitting: Cardiology

## 2022-03-22 NOTE — Progress Notes (Unsigned)
Cardiology Office Note  Date: 03/23/2022   ID: Aaron Sheppard, DOB [birth year], MRN [id number]  PCP:  [facility]  Cardiologist:  Aaron Dell, MD Electrophysiologist:  None   Chief Complaint  Patient presents with   Fatigue and shortness of breath    History of Present Illness: Aaron Sheppard is a 61 y.o. male referred for cardiology consultation by Aaron Sheppard for the evaluation of shortness of breath.  His PCP is now actually [name] PA-C with [facility].  I reviewed the available records.  He states that over the last month or so he has been experiencing increasing dyspnea on exertion and fatigue, mainly when he is walking uphill.  No sense of chest tightness or pressure.  Does not have the energy that he once had.  No associated palpitations or syncope.  He states that he has a sister that died at age 56 with a heart attack.  He has not undergone any prior cardiovascular testing.  No ECG sent for consultation, we are requesting for review.  I reviewed his medications and also most recent lab work.  LDL was 103 in August.   Past Medical History:  Diagnosis Date   Anxiety    GERD (gastroesophageal reflux disease)    Morton neuroma    Postherpetic neuralgia     Past Surgical History:  Procedure Laterality Date   COLONOSCOPY N/A 09/08/2012   Procedure: COLONOSCOPY;  Surgeon: Aaron Ade, MD;  [facility];  Service: Endoscopy;  Laterality: N/A;   TONSILLECTOMY      Current Outpatient Medications  Medication Sig Dispense Refill   ALPRAZolam (XANAX) 1 MG tablet 1 mg 4 (four) times daily as needed for anxiety.      metoprolol tartrate (LOPRESSOR) 100 MG tablet Take 1 tablet (100 mg total) by mouth once for 1 dose. 2 hours before your CT scan 1 tablet 0   No current facility-administered medications for this visit.   Allergies:  Patient has no known allergies.   Social History: The patient  reports that he quit smoking about 15 months  ago. His smoking use included cigarettes. He has a 3.75 pack-year smoking history. He does not have any smokeless tobacco history on file. He reports current alcohol use. He reports that he does not use drugs.   Family History: The patient's family history includes Brain cancer in his father; Cancer in an other family member; Deep vein thrombosis in his mother.   ROS: No apnea or PND.  No syncope.  Physical Exam: VS:  BP 128/84   Pulse 73   Ht 5\' 9"  (1.753 m)   Wt 208 lb 9.6 oz (94.6 kg)   SpO2 92%   BMI 30.80 kg/m , BMI Body mass index is 30.8 kg/m.  Wt Readings from Last 3 Encounters:  03/23/22 208 lb 9.6 oz (94.6 kg)  08/09/14 182 lb (82.6 kg)  08/09/14 182 lb (82.6 kg)    General: Patient appears comfortable at rest. HEENT: Conjunctiva and lids normal. Neck: Supple, no elevated JVP or carotid bruits, no thyromegaly. Lungs: Clear to auscultation, nonlabored breathing at rest. Cardiac: Regular rate and rhythm, no S3 or significant systolic murmur, no pericardial rub. Abdomen: Soft, nontender, bowel sounds present. Extremities: No pitting edema, distal pulses 2+. Skin: Warm and dry. Musculoskeletal: No kyphosis. Neuropsychiatric: Alert and oriented x3, affect grossly appropriate.  ECG:   No prior tracings available for review today.  Recent Labwork:  August 2023: Hemoglobin A1c 6.5%,  creatinine 1.25, cholesterol 159, triglycerides 133, HDL 32, LDL 103, hemoglobin 16, platelets 206, TSH 1.64  Other Studies Reviewed Today:  No prior cardiac testing for review today.  Assessment and Plan:  Dyspnea exertion and fatigue concerning for angina pectoris in a 61 year old male with family history of premature CAD (sister died at age 40 with a myocardial infarction), LDL 103, previous tobacco use history.  He has not undergone any prior cardiovascular testing, requesting ECG from PCP.  We will proceed with a coronary CTA for further evaluation.  Medication Adjustments/Labs and  Tests Ordered: Current medicines are reviewed at length with the patient today.  Concerns regarding medicines are outlined above.   Tests Ordered: Orders Placed This Encounter  Procedures   CT CORONARY MORPH W/CTA COR W/SCORE W/CA W/CM &/OR WO/CM   Basic metabolic panel    Medication Changes: Meds ordered this encounter  Medications   metoprolol tartrate (LOPRESSOR) 100 MG tablet    Sig: Take 1 tablet (100 mg total) by mouth once for 1 dose. 2 hours before your CT scan    Dispense:  1 tablet    Refill:  0    Disposition:  Follow up  test results.  Signed, Aaron Sark, MD, [facility] 03/23/2022 11:10 AM    [facility], [place], [place] [address] Phone: [phone]; Fax: [phone]

## 2022-03-23 ENCOUNTER — Encounter: Payer: Self-pay | Admitting: Cardiology

## 2022-03-23 ENCOUNTER — Encounter: Payer: Self-pay | Admitting: *Deleted

## 2022-03-23 ENCOUNTER — Ambulatory Visit: Payer: Managed Care, Other (non HMO) | Attending: Cardiology | Admitting: Cardiology

## 2022-03-23 VITALS — BP 128/84 | HR 73 | Ht 69.0 in | Wt 208.6 lb

## 2022-03-23 DIAGNOSIS — F17201 Nicotine dependence, unspecified, in remission: Secondary | ICD-10-CM

## 2022-03-23 DIAGNOSIS — I209 Angina pectoris, unspecified: Secondary | ICD-10-CM

## 2022-03-23 DIAGNOSIS — R0602 Shortness of breath: Secondary | ICD-10-CM

## 2022-03-23 DIAGNOSIS — Z8249 Family history of ischemic heart disease and other diseases of the circulatory system: Secondary | ICD-10-CM | POA: Diagnosis not present

## 2022-03-23 MED ORDER — METOPROLOL TARTRATE 100 MG PO TABS: 100.0000 mg | ORAL_TABLET | Freq: Once | ORAL | 0 refills | Status: AC

## 2022-03-23 NOTE — Patient Instructions (Addendum)
Medication Instructions:  Your physician recommends that you continue on your current medications as directed. Please refer to the Current Medication list given to you today.  Labwork: BMET in the next week-before your CT scan [facility] Non-fasting  Testing/Procedures: Coronary CTA  Follow-Up: Your physician recommends that you schedule a follow-up appointment in: pending  Any Other Special Instructions Will Be Listed Below (If Applicable).  If you need a refill on your cardiac medications before your next appointment, please call your pharmacy.    Your cardiac CT will be scheduled at one of the below locations:   [facility] [address] [place], [place] [address] [phone]   If scheduled at [facility], please arrive at the [facility] Entrance (Entrance C2) of [facility] 30 minutes prior to test start time. You can use the FREE valet parking offered at entrance C (encouraged to control the heart rate for the test)  Proceed to the [facility] Radiology Department (first floor) to check-in and test prep.  All radiology patients and guests should use entrance C2 at [facility], accessed from [facility], even though the hospital's physical address listed is [address]     Please follow these instructions carefully (unless otherwise directed):  Hold all erectile dysfunction medications at least 3 days (72 hrs) prior to test. (Ie viagra, cialis, sildenafil, tadalafil, etc) We will administer nitroglycerin during this exam.   On the Night Before the Test: Be sure to Drink plenty of water. Do not consume any caffeinated/decaffeinated beverages or chocolate 12 hours prior to your test. Do not take any antihistamines 12 hours prior to your test. If the patient has contrast allergy: No contrast allergy  On the Day of the Test: Drink plenty of water until 1 hour prior to the test. Do not eat any  food 1 hour prior to test. You may take your regular medications prior to the test.  Take metoprolol (Lopressor) two hours prior to test.       After the Test: Drink plenty of water. After receiving IV contrast, you may experience a mild flushed feeling. This is normal. On occasion, you may experience a mild rash up to 24 hours after the test. This is not dangerous. If this occurs, you can take Benadryl 25 mg and increase your fluid intake. If you experience trouble breathing, this can be serious. If it is severe call 911 IMMEDIATELY. If it is mild, please call our office.  We will call to schedule your test 2-4 weeks out understanding that some insurance companies will need an authorization prior to the service being performed.   For non-scheduling related questions, please contact the cardiac imaging nurse navigator should you have any questions/concerns: Rockwell [place], Cardiac Imaging Nurse Navigator Larey Brick, Cardiac Imaging Nurse Navigator [facility] Direct Office Dial: [phone]   For scheduling needs, including cancellations and rescheduling, please call [place], [phone].

## 2022-03-24 ENCOUNTER — Other Ambulatory Visit: Payer: Self-pay | Admitting: Cardiology

## 2022-03-27 LAB — BASIC METABOLIC PANEL
BUN/Creatinine Ratio: 14 (ref 10–24)
BUN: 16 mg/dL (ref 8–27)
CO2: 25 mmol/L (ref 20–29)
Calcium: 10 mg/dL (ref 8.6–10.2)
Chloride: 100 mmol/L (ref 96–106)
Creatinine, Ser: 1.16 mg/dL (ref 0.76–1.27)
Glucose: 91 mg/dL (ref 70–99)
Potassium: 4.3 mmol/L (ref 3.5–5.2)
Sodium: 139 mmol/L (ref 134–144)
eGFR: 71 mL/min/{1.73_m2} (ref >59)

## 2022-04-01 ENCOUNTER — Telehealth (HOSPITAL_COMMUNITY): Payer: Self-pay | Admitting: *Deleted

## 2022-04-01 NOTE — Telephone Encounter (Signed)
Reaching out to patient to offer assistance regarding upcoming cardiac imaging study; pt verbalizes understanding of appt date/time, parking situation and where to check in,  medications ordered, and verified current allergies; name and call back number provided for further questions should they arise  Larey Brick RN Navigator Cardiac Imaging [facility] [phone] office [phone] cell  Patient to take 100mg  metoprolol tartrate two hours prior to his cardiac CT scan.  He is aware to arrive at 3pm.

## 2022-04-03 ENCOUNTER — Ambulatory Visit (HOSPITAL_COMMUNITY)
Admission: RE | Admit: 2022-04-03 | Discharge: 2022-04-03 | Disposition: A | Discharge status: Home / Self Care | Payer: Managed Care, Other (non HMO) | Source: Ambulatory Visit | Attending: Cardiology | Admitting: Cardiology

## 2022-04-03 DIAGNOSIS — I209 Angina pectoris, unspecified: Secondary | ICD-10-CM

## 2022-04-03 MED ORDER — NITROGLYCERIN 0.4 MG SL SUBL
SUBLINGUAL_TABLET | SUBLINGUAL | Status: AC
  Filled 2022-04-03: qty 2

## 2022-04-03 MED ORDER — IOHEXOL 350 MG/ML SOLN
100.0000 mL | Freq: Once | INTRAVENOUS | Status: AC | PRN
  Administered 2022-04-03: 100 mL via INTRAVENOUS

## 2022-04-03 MED ORDER — NITROGLYCERIN 0.4 MG SL SUBL
0.8000 mg | SUBLINGUAL_TABLET | Freq: Once | SUBLINGUAL | Status: AC
  Administered 2022-04-03: 0.8 mg via SUBLINGUAL

## 2022-08-20 ENCOUNTER — Ambulatory Visit: Payer: Managed Care, Other (non HMO) | Admitting: Gastroenterology

## 2022-08-20 ENCOUNTER — Encounter: Payer: Self-pay | Admitting: *Deleted

## 2022-10-28 ENCOUNTER — Other Ambulatory Visit (HOSPITAL_COMMUNITY): Payer: Self-pay | Admitting: Medical

## 2022-10-28 ENCOUNTER — Ambulatory Visit (HOSPITAL_COMMUNITY)
Admission: RE | Admit: 2022-10-28 | Discharge: 2022-10-28 | Disposition: A | Discharge status: Home / Self Care | Payer: Managed Care, Other (non HMO) | Source: Ambulatory Visit | Attending: Internal Medicine | Admitting: Internal Medicine

## 2022-10-28 DIAGNOSIS — M7989 Other specified soft tissue disorders: Secondary | ICD-10-CM | POA: Diagnosis present

## 2022-10-28 DIAGNOSIS — M79662 Pain in left lower leg: Secondary | ICD-10-CM | POA: Diagnosis present

## 2024-03-16 LAB — COLOGUARD: COLOGUARD: NEGATIVE

## 2024-04-26 ENCOUNTER — Ambulatory Visit: Admitting: Urology

## 2024-04-26 ENCOUNTER — Encounter: Payer: Self-pay | Admitting: Urology

## 2024-04-26 VITALS — BP 132/73 | HR 65

## 2024-04-26 DIAGNOSIS — R350 Frequency of micturition: Secondary | ICD-10-CM

## 2024-04-26 DIAGNOSIS — N411 Chronic prostatitis: Secondary | ICD-10-CM

## 2024-04-26 DIAGNOSIS — R3912 Poor urinary stream: Secondary | ICD-10-CM | POA: Diagnosis not present

## 2024-04-26 DIAGNOSIS — N401 Enlarged prostate with lower urinary tract symptoms: Secondary | ICD-10-CM | POA: Diagnosis not present

## 2024-04-26 LAB — URINALYSIS, ROUTINE W REFLEX MICROSCOPIC
Bilirubin, UA: NEGATIVE
Glucose, UA: NEGATIVE
Ketones, UA: NEGATIVE
Leukocytes,UA: NEGATIVE
Nitrite, UA: NEGATIVE
Protein,UA: NEGATIVE
Specific Gravity, UA: 1.025 (ref 1.005–1.030)
Urobilinogen, Ur: 0.2 mg/dL (ref 0.2–1.0)
pH, UA: 5.5 (ref 5.0–7.5)

## 2024-04-26 LAB — MICROSCOPIC EXAMINATION
Bacteria, UA: NONE SEEN
WBC, UA: NONE SEEN /HPF (ref 0–5)

## 2024-04-26 LAB — BLADDER SCAN AMB NON-IMAGING: Scan Result: 35

## 2024-04-26 MED ORDER — DOXYCYCLINE HYCLATE 100 MG PO CAPS: 100.0000 mg | ORAL_CAPSULE | Freq: Two times a day (BID) | ORAL | 0 refills | Status: AC

## 2024-04-26 NOTE — Patient Instructions (Signed)
 Prostatitis  Prostatitis is swelling of the prostate gland, also called the prostate. This gland is about 1.5 inches wide and 1 inch high, and it helps to make a fluid called semen. The prostate is below a man's bladder, in front of the butt (rectum). There are different types of prostatitis. What are the causes? One type of prostatitis is caused by an infection from germs (bacteria). Another type is not caused by germs. It may be caused by: Things having to do with the nervous system. This system includes thebrain, spinal cord, and nerves. An autoimmune response. This happens when the body's disease-fighting system attacks healthy tissue in the body by mistake. Psychological factors. These have to do with how the mind works. The causes of other types of prostatitis are normally not known. What are the signs or symptoms? Symptoms of this condition depend on the type of prostatitis you have. If your condition is caused by germs: You may feel pain or burning when you pee (urinate). You may pee often and all of a sudden. You may have problems starting to pee. You may have trouble emptying your bladder when you pee. You may have fever or chills. You may feel pain in your muscles, joints, low back, or lower belly. If you have other types of prostatitis: You may pee often or all of a sudden. You may have trouble starting to pee. You may have a weak flow when you pee. You may leak pee after using the bathroom. You may have other problems, such as: Abnormal fluid coming from the penis. Pain in the testicles or penis. Pain between the butt and the testicles. Pain when fluid comes out of the penis during sex. How is this treated? Treatment for this condition depends on the type of prostatitis. Treatment may include: Medicines. These may treat pain or swelling, or they may help relax muscles. Exercises to help you move better or get stronger (physical therapy). Heat therapy. Techniques to help  you control some of the ways that your body works. Exercises to help you relax. Antibiotic medicine, if your condition is caused by germs. Warm water baths (sitz baths) to relax muscles. Follow these instructions at home: Medicines Take over-the-counter and prescription medicines only as told by your doctor. If you were prescribed an antibiotic medicine, take it as told by your doctor. Do not stop using the antibiotic even if you start to feel better. Managing pain and swelling  Take sitz baths as told by your doctor. For a sitz bath, sit in warm water that is deep enough to cover your hips and butt. If told, put heat on the painful area. Do this as often as told by your doctor. Use the heat source that your doctor recommends, such as a moist heat pack or a heating pad. Place a towel between your skin and the heat source. Leave the heat on for 20-30 minutes. Take off the heat if your skin turns bright red. This is very important if you are unable to feel pain, heat, or cold. You may have a greater risk of getting burned. General instructions Do exercises as told by your doctor, if your doctor prescribed them. Keep all follow-up visits as told by your doctor. This is important. Where to find more information [employer] of Diabetes and Digestive and Kidney Diseases: LowApproval.se Contact a doctor if: Your symptoms get worse. You have a fever. Get help right away if: You have chills. You feel light-headed. You feel like you may  faint. You cannot pee. You have blood or clumps of blood (blood clots) in your pee. Summary Prostatitis is swelling of the prostate gland. There are different types of prostatitis. Treatment depends on the type that you have. Take over-the-counter and prescription medicines only as told by your doctor. Get help right away of you have chills, feel light-headed, or feel like you may faint. Also get help right away if you cannot pee or you have  blood or clumps of blood in your pee. This information is not intended to replace advice given to you by your health care provider. Make sure you discuss any questions you have with your health care provider. Document Revised: 03/12/2022 Document Reviewed: 03/12/2022 Elsevier Patient Education  2024 [employer].

## 2024-04-26 NOTE — Progress Notes (Signed)
 04/26/2024 2:55 PM   Aaron Sheppard Slot 04-06-1961 [id number]  Referring provider: Suanne Pfeiffer, NP [address] [place],  [place] [address]  Painful urination   HPI: Mr Aaron Sheppard is a 63yo here for evaluation of BPH with a weak urinary stream. IPSS 10 QOL 3 on uroxatral 10mg  daily. He has been on uroxatral 10mg  for 2 months which has significantly improved his LUTS. He has issues with intermittent weak urinary stream. He has pain Sheppard the base of his penis which dull and constant. He was noted treated for prostatitis. He has intermittent dysuria. PSA 3.0.   PMH: Past Medical History:  Diagnosis Date   Anxiety    GERD (gastroesophageal reflux disease)    Morton neuroma    Postherpetic neuralgia     Surgical History: Past Surgical History:  Procedure Laterality Date   COLONOSCOPY N/A 09/08/2012   Procedure: COLONOSCOPY;  Surgeon: Aaron CHRISTELLA Hollingshead, MD;  [facility];  Service: Endoscopy;  Laterality: N/A;   TONSILLECTOMY      Home Medications:  Allergies as of 04/26/2024   No Known Allergies      Medication List        Accurate as of April 26, 2024  2:55 PM. If you have any questions, ask your nurse or doctor.          ALPRAZolam 1 MG tablet Commonly known as: XANAX 1 mg 4 (four) times daily as needed for anxiety.   metoprolol  tartrate 100 MG tablet Commonly known as: LOPRESSOR  Take 1 tablet (100 mg total) by mouth once for 1 dose. 2 hours before your CT scan        Allergies: Allergies[1]  Family History: Family History  Problem Relation Age of Onset   Deep vein thrombosis Mother    Brain cancer Father    Cancer Other        2 aunts, 3 uncles not sure what type   Colon cancer Neg Hx    Liver disease Neg Hx     Social History:  reports that he quit smoking about 3 years ago. His smoking use included cigarettes. He started smoking about 8 years ago. He has a 3.8 pack-year smoking history. He does not have any smokeless tobacco  history on file. He reports current alcohol use. He reports that he does not use drugs.  ROS: All other review of systems were reviewed and are negative except what is noted above in HPI  Physical Exam: BP 132/73   Pulse 65   Constitutional:  Alert and oriented, No acute distress. HEENT: Aaron Sheppard, moist mucus membranes.  Trachea midline, no masses. Cardiovascular: No clubbing, cyanosis, or edema. Respiratory: Normal respiratory effort, no increased work of breathing. GI: Abdomen is soft, nontender, nondistended, no abdominal masses GU: No CVA tenderness. Prostate 50g smooth, mildly tender  Lymph: No cervical or inguinal lymphadenopathy. Skin: No rashes, bruises or suspicious lesions. Neurologic: Grossly intact, no focal deficits, moving all 4 extremities. Psychiatric: Normal mood and affect.  Laboratory Data: No results found for: WBC, HGB, HCT, MCV, PLT  Lab Results  Component Value Date   CREATININE 1.16 03/26/2022    No results found for: PSA  No results found for: TESTOSTERONE  No results found for: HGBA1C  Urinalysis    Component Value Date/Time   COLORURINE STRAW (A) 12/06/2018 1632   APPEARANCEUR CLEAR 12/06/2018 1632   LABSPEC 1.006 12/06/2018 1632   PHURINE 5.0 12/06/2018 1632   GLUCOSEU NEGATIVE 12/06/2018  1632   HGBUR SMALL (A) 12/06/2018 1632   BILIRUBINUR NEGATIVE 12/06/2018 1632   KETONESUR NEGATIVE 12/06/2018 1632   PROTEINUR NEGATIVE 12/06/2018 1632   UROBILINOGEN 0.2 07/03/2014 0601   NITRITE NEGATIVE 12/06/2018 1632   LEUKOCYTESUR NEGATIVE 12/06/2018 1632    Lab Results  Component Value Date   BACTERIA NONE SEEN 12/06/2018    Pertinent Imaging:  No results found for this or any previous visit.  No results found for this or any previous visit.  No results found for this or any previous visit.  No results found for this or any previous visit.  No results found for this or any previous visit.  No results found for this or  any previous visit.  No results found for this or any previous visit.  No results found for this or any previous visit.   Assessment & Plan:    1. Benign prostatic hyperplasia with urinary frequency (Primary) Continue uroxatral 10mg   - Urinalysis, Routine w reflex microscopic - BLADDER SCAN AMB NON-IMAGING  2. Weak urinary stream Continue uroxatral 10mg   3. Chronic prostatitis -doxycycline  100mg  BID for 28 days   No follow-ups on file.  Aaron Clara, MD  [facility] [place]      [1] No Known Allergies

## 2024-04-26 NOTE — Progress Notes (Signed)
   Patient can void prior to the bladder scan. Bladder scan result: 35  Performed By: [facility] LPN

## 2024-10-30 ENCOUNTER — Ambulatory Visit: Admitting: Urology
